# Patient Record
Sex: Male | Born: 1995 | Race: White | Hispanic: No | Marital: Single | Smoking: Never smoker
Health system: Southern US, Community
[De-identification: ages and names within clinical notes are randomized; demographics above are authoritative.]

---
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
## 2015-11-19 ENCOUNTER — Encounter (INDEPENDENT_AMBULATORY_CARE_PROVIDER_SITE_OTHER): Payer: Self-pay | Admitting: Sports Medicine

## 2015-11-19 ENCOUNTER — Ambulatory Visit (INDEPENDENT_AMBULATORY_CARE_PROVIDER_SITE_OTHER): Payer: BLUE CROSS/BLUE SHIELD | Admitting: Sports Medicine

## 2015-11-19 VITALS — BP 136/81 | HR 90 | Ht 74.0 in | Wt 235.0 lb

## 2015-11-19 DIAGNOSIS — M9902 Segmental and somatic dysfunction of thoracic region: Secondary | ICD-10-CM | POA: Diagnosis not present

## 2015-11-19 DIAGNOSIS — M9905 Segmental and somatic dysfunction of pelvic region: Secondary | ICD-10-CM

## 2015-11-19 DIAGNOSIS — M9903 Segmental and somatic dysfunction of lumbar region: Secondary | ICD-10-CM

## 2015-11-19 DIAGNOSIS — M545 Low back pain, unspecified: Secondary | ICD-10-CM

## 2015-11-19 DIAGNOSIS — M722 Plantar fascial fibromatosis: Secondary | ICD-10-CM

## 2015-11-19 NOTE — Procedures (Signed)
PROCEDURE NOTE : Osteopathic Exam & Manipulation REGION: OSTEOPATHIC EXAM FINDINGS  CERVICAL:   THORACIC: T6 F RrSr T8 N RlSr T9 N RrSr T10 N RrSr  LUMBAR: L1 F RrSr L5 F RrSr  SACRAL:   PELVIC: Left anterior innonimate  RIBS:   UE:   LE:   CRANIAL:   VISCERAL:    The decision today to treat with Osteopathic Manipulative Therapy (OMT) was based on physical exam findings. Verbal consent was obtained after after explanation of risks, benefits and potential side effects, including acute pain flare, post manipulation soreness and need for repeat treatments.  If Cervical manipulation was performed additional time was spent discussing the associated minimal risk of  injury to neurovascular structures.  After consent was obtained manipulation was performed as below:            Regions treated:  Per billing codes          Techniques used:  Muscle Energy, MFR and HVLA The patient tolerated the treatment well and reported Improved symptoms following treatment today. Patient was given medications, exercises, stretches and lifestyle modifications per AVS and verbally.

## 2015-11-19 NOTE — Progress Notes (Signed)
Brendan Bush - 20 y.o. male MRN [id number]  Date of birth: [birth year]  Office Visit Note: Visit Date: 11/19/2015 PCP: No PCP Per Patient Referred by: No ref. provider found  Subjective: Chief Complaint  Patient presents with  . Middle Back - Pain  . Right Foot - Pain   HPI: Patient states hurt his back about a year ago by running up the stairs.  Pain comes and goes sometimes when making certain movements.  Pain stays in back. No numbness/tingling. Right foot hurts when walking.  Pain is right below arch/heel.  Cant' apply pressure in the mornings.    ROS Otherwise per HPI.  Assessment & Plan: Visit Diagnoses:  1. Acute bilateral low back pain without sciatica   2. Plantar fascial fibromatosis   3. Segmental and somatic dysfunction of thoracic region   4. Segmental and somatic dysfunction of lumbar region   5. Segmental and somatic dysfunction of pelvic region     Plan:   Body Helix compression sleeve. [employer] spine conditioning program & so is stretching. Alfredson protocol for his heel.               There are no Patient Instructions on file for this visit.  Meds & Orders: No orders of the defined types were placed in this encounter.   Orders Placed This Encounter  Procedures  . OSTEOPATHIC MANIPULATION TREATMENT  . US Extrem Low Right Ltd    Follow-up: Return if symptoms worsen or fail to improve.   Procedures: No procedures performed  PROCEDURE NOTE : Osteopathic Exam & Manipulation REGION: OSTEOPATHIC EXAM FINDINGS  CERVICAL:   THORACIC: T6 F RrSr T8 N RlSr T9 N RrSr T10 N RrSr  LUMBAR: L1 F RrSr L5 F RrSr  SACRAL:   PELVIC: Left anterior innonimate  RIBS:   UE:   LE:   CRANIAL:   VISCERAL:    The decision today to treat with Osteopathic Manipulative Therapy (OMT) was based on physical exam findings. Verbal consent was obtained after after explanation of risks, benefits and potential side effects, including acute pain flare, post manipulation soreness and  need for repeat treatments.  If Cervical manipulation was performed additional time was spent discussing the associated minimal risk of  injury to neurovascular structures.  After consent was obtained manipulation was performed as below:            Regions treated:  Per billing codes          Techniques used:  Muscle Energy, MFR and HVLA The patient tolerated the treatment well and reported Improved symptoms following treatment today. Patient was given medications, exercises, stretches and lifestyle modifications per AVS and verbally.     Clinical History: No additional findings.  He reports that he has never smoked. He has never used smokeless tobacco. No results for input(s): HGBA1C, LABURIC in the last 8760 hours.  Objective:  VS:  HT:6\' 2"  (188 cm)   WT:235 lb (106.6 kg)  BMI:30.2    BP:136/81  HR:90bpm  TEMP: ( )  RESP:  Physical Exam  Constitutional: No distress.  HENT:  Head: Normocephalic and atraumatic.  Eyes: Right eye exhibits no discharge. Left eye exhibits no discharge. No scleral icterus.  Pulmonary/Chest: Effort normal. No respiratory distress.  Musculoskeletal:  BACK: Bilateral negative straight leg raise. Sensation intact to light touch in bilateral lower extremities No significant midline tenderness.  Thoracolumbar junction paraspinal musculature. Good internal and external rotation of the hips. Patient is able to heel and toe  walk without significant difficulty Manual muscle testing is 5+/5 without focality Lower extremity DTRs 2+/4 diffusely and symmetric  FOOT: Pes planus. Moderate TTP over the midportion of the plantar fascia. No focal tenderness at the insertion. DP & PT pulses 2+/4. No significant pretibial edema.  Neurological: He is alert.  Appropriately interactive.  Skin: Skin is warm and dry. No rash noted. He is not diaphoretic. No erythema. No pallor.  Psychiatric: Judgment normal.    Ortho Exam Imaging: [place] Extrem Low Right Ltd  Result Date:  11/21/2015 Limited MSK Ultrasound of: Right plantar surface of the foot Images were obtained and interpreted by myself, Gaspar Bidding, DO using a GE logic E Ultrasound. Findings: Normal insertion of the plantar fascia. Marked thickening within the mid portion of the plantar fascia consistent with a single layer fashion fibroma. No increased neovessels or other abnormal characteristics. Impression: Plantar fascial fibroma with normal insertion   Past Medical/Family/Surgical/Social History: Patient Active Problem List   Diagnosis Date Noted  . Plantar fascial fibromatosis 11/21/2015   History reviewed. No pertinent past medical history. Family History  Problem Relation Age of Onset  . High blood pressure Mother   . High blood pressure Father   . Heart disease Paternal Uncle   . Diabetes Maternal Grandmother    History reviewed. No pertinent surgical history. Social History   Occupational History  . Not on file.   Social History Main Topics  . Smoking status: Never Smoker  . Smokeless tobacco: Never Used  . Alcohol use No  . Drug use: No  . Sexual activity: Not on file

## 2015-11-21 DIAGNOSIS — M722 Plantar fascial fibromatosis: Secondary | ICD-10-CM | POA: Insufficient documentation

## 2015-11-22 ENCOUNTER — Inpatient Hospital Stay (INDEPENDENT_AMBULATORY_CARE_PROVIDER_SITE_OTHER): Payer: Self-pay

## 2016-02-18 ENCOUNTER — Encounter: Payer: Self-pay | Admitting: Sports Medicine

## 2016-02-18 ENCOUNTER — Ambulatory Visit (INDEPENDENT_AMBULATORY_CARE_PROVIDER_SITE_OTHER): Payer: 59 | Admitting: Sports Medicine

## 2016-02-18 VITALS — BP 130/82 | HR 86 | Ht 74.0 in | Wt 243.4 lb

## 2016-02-18 DIAGNOSIS — M9902 Segmental and somatic dysfunction of thoracic region: Secondary | ICD-10-CM | POA: Diagnosis not present

## 2016-02-18 DIAGNOSIS — M9908 Segmental and somatic dysfunction of rib cage: Secondary | ICD-10-CM

## 2016-02-18 DIAGNOSIS — M9901 Segmental and somatic dysfunction of cervical region: Secondary | ICD-10-CM | POA: Diagnosis not present

## 2016-02-18 DIAGNOSIS — M545 Low back pain, unspecified: Secondary | ICD-10-CM

## 2016-02-18 DIAGNOSIS — M9903 Segmental and somatic dysfunction of lumbar region: Secondary | ICD-10-CM | POA: Diagnosis not present

## 2016-02-18 NOTE — Progress Notes (Signed)
Meredith ModyJustin Harth - 21 y.o. male MRN [id number]  Date of birth: September 15, 1995  Office Visit Note: Visit Date: 02/18/2016 PCP: No PCP Per Patient Referred by: No ref. provider found  Subjective: Chief Complaint  Patient presents with  . Follow-up    Reevaluation for mid to low back pain   HPI: 2 weeks of worsening mid to low back pain is progressively significantly worsened over the past 2 days.  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  Pain is nonradiating but is sharp and takes his breath away at times.  Pain is worse with movement.  He has taken ibuprofen   ROS: Pt denies any change in bowel or bladder habits, muscle weakness, numbness or falls associated with this pain.. Otherwise per HPI.   Clinical History: No specialty comments available.  He reports that he has never smoked. He has never used smokeless tobacco.  No results for input(s): HGBA1C, LABURIC in the last 8760 hours.  Assessment & Plan: Visit Diagnoses:    ICD-9-CM ICD-10-CM   1. Acute bilateral low back pain without sciatica 724.2 M54.5    338.19    2. Segmental and somatic dysfunction of cervical region 739.1 M99.01   3. Segmental and somatic dysfunction of thoracic region 739.2 M99.02   4. Somatic dysfunction of lumbar region 739.3 M99.03   5. Somatic dysfunction of rib 739.8 M99.08     Plan: Osteopathic manipulation as below.  Therapeutic exercises reviewed including thoracic mobilization, core strengthening and towel stretching.  Follow-up: Return if symptoms worsen or fail to improve.  Meds: No orders of the defined types were placed in this encounter.  Procedures: 97110; 15 minutes spent for Therapeutic exercises as stated in above notes.  This included exercises focusing on stretching, strengthening, with significant focus on eccentric aspects.   Proper technique shown and discussed handout in great detail with ATC.  All questions were discussed and  answered.    PROCEDURE NOTE : Osteopathic Exam & Manipulation REGION: OSTEOPATHIC EXAM FINDINGS  CERVICAL: C6 FRS right   THORACIC: T2 FRS left T4 FRS right T6-7-8 neutral side bent left, rotated right   LUMBAR: L3 FRS right   RIBS: Posterior right rib 8    The decision today to treat with Osteopathic Manipulative Therapy (OMT) was based on physical exam findings. Verbal consent was obtained after after explanation of risks, benefits and potential side effects, including acute pain flare, post manipulation soreness and need for repeat treatments.  If Cervical manipulation was performed additional time was spent discussing the associated minimal risk of  injury to neurovascular structures.  After consent was obtained manipulation was performed as below:            Regions treated:  Per billing codes          Techniques used:  Muscle Energy, MFR, HVLA and ART The patient tolerated the treatment well and reported Improved symptoms following treatment today. Patient was given medications, exercises, stretches and lifestyle modifications per AVS and verbally.    Objective:  VS:  HT:6\' 2"  (188 cm)   WT:243 lb 6.4 oz (110.4 kg)  BMI:31.3    BP:130/82  HR:86bpm  TEMP: ( )  RESP:98 % Physical Exam:   Adult male. Alert and appropriate.  In no acute distress.  Upper extremities are overall well aligned with no significant deformity. No significant swelling.  Distal pulses 2+/4. No significant bruising/ecchymosis or erythema the skin Back: Overall well aligned.  Ambulates normally.  Anterior chain dominant.  Lower extremity strength intact. Imaging: No results found.  Past Medical/Family/Surgical/Social History: Medications & Allergies reviewed per EMR Patient Active Problem List   Diagnosis Date Noted  . Plantar fascial fibromatosis 11/21/2015   No past medical history on file. Family History  Problem Relation Age of Onset  . High blood pressure Mother   . High blood pressure Father     . Heart disease Paternal Uncle   . Diabetes Maternal Grandmother    No past surgical history on file. Social History   Occupational History  . Not on file.   Social History Main Topics  . Smoking status: Never Smoker  . Smokeless tobacco: Never Used  . Alcohol use No  . Drug use: No  . Sexual activity: Not on file

## 2020-04-19 ENCOUNTER — Ambulatory Visit: Payer: 59 | Admitting: Family Medicine

## 2020-04-19 ENCOUNTER — Ambulatory Visit: Payer: BC Managed Care – PPO | Admitting: Family Medicine

## 2020-04-19 ENCOUNTER — Encounter: Payer: Self-pay | Admitting: Family Medicine

## 2020-04-19 ENCOUNTER — Other Ambulatory Visit: Payer: Self-pay

## 2020-04-19 DIAGNOSIS — M545 Low back pain, unspecified: Secondary | ICD-10-CM

## 2020-04-19 MED ORDER — TIZANIDINE HCL 2 MG PO TABS: 2.0000 mg | ORAL_TABLET | Freq: Every evening | ORAL | 1 refills | Status: AC | PRN

## 2020-04-19 MED ORDER — METHYLPREDNISOLONE 4 MG PO TBPK: ORAL_TABLET | ORAL | 0 refills | Status: AC

## 2020-04-19 NOTE — Progress Notes (Signed)
   Office Visit Note   Patient: Brendan Bush           Date of Birth: [birth year]           MRN: [id number] Visit Date: 04/19/2020 Requested by: No referring provider defined for this encounter. PCP: Patient, No Pcp Per  Subjective: Chief Complaint  Patient presents with  . Lower Back - Pain    Left-sided lower back pain x 2 weeks. NKI. It was worse initially, in that the pain was going down the leg. It no longer does that. His back "catches" when he stands up, leaving him a little bit bent until he presses on the middle part of his back - can straighten out then.    HPI: He is here with left-sided low back pain.  He is working for [employer], 2 weeks ago he started noticing pain in the left posterior hip with radiation into his hamstring area.  Pain was severe but he managed to continue working.  Since he made this appointment, the leg pain has resolved but he still has pain in the posterior hip/low back.  It hurts when standing straight up, or when twisting toward the left.  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  He has the climb and drive short distances as well.  No previous problems with his low back.  Denies any bowel or bladder dysfunction, weakness or numbness.  He took some ibuprofen which helped.                ROS:   All other systems were reviewed and are negative.  Objective: Vital Signs: There were no vitals taken for this visit.  Physical Exam:  General:  Alert and oriented, in no acute distress. Pulm:  Breathing unlabored. Psy:  Normal mood, congruent affect. Skin:  No rash  Low back: He is tender primarily at the superior aspect of the left SI joint.  No significant pain in the sciatic notch, stork test is negative.  Straight leg raise is negative but he has bilateral tight hamstrings.  Lower extremity strength and reflexes are  normal.  Imaging: No results found.  Assessment & Plan: 1.  Improving left-sided low back/posterior hip pain, suspect sacroiliac dysfunction but cannot rule out lumbar disc protrusion. -Home exercises given, medications to take as needed.  Physical [facility] at [facility]  If symptoms persist or worsen, then x-rays and possibly MRI scan.     Procedures: No procedures performed        PMFS History: Patient Active Problem List   Diagnosis Date Noted  . Plantar fascial fibromatosis 11/21/2015   History reviewed. No pertinent past medical history.  Family History  Problem Relation Age of Onset  . High blood pressure Mother   . High blood pressure Father   . Heart disease Paternal Uncle   . Diabetes Maternal Grandmother     History reviewed. No pertinent surgical history. Social History   Occupational History  . Not on file  Tobacco Use  . Smoking status: Never Smoker  . Smokeless tobacco: Never Used  Substance and Sexual Activity  . Alcohol use: No  . Drug use: No  . Sexual activity: Not on file

## 2021-04-15 ENCOUNTER — Other Ambulatory Visit: Payer: Self-pay

## 2021-04-15 ENCOUNTER — Other Ambulatory Visit: Payer: Self-pay | Admitting: Sports Medicine

## 2021-04-15 ENCOUNTER — Ambulatory Visit
Admission: RE | Admit: 2021-04-15 | Discharge: 2021-04-15 | Disposition: A | Discharge status: Home / Self Care | Payer: BC Managed Care – PPO | Source: Ambulatory Visit | Attending: Sports Medicine | Admitting: Sports Medicine

## 2021-04-15 DIAGNOSIS — M545 Low back pain, unspecified: Secondary | ICD-10-CM

## 2021-04-15 DIAGNOSIS — M546 Pain in thoracic spine: Secondary | ICD-10-CM
